# Patient Record
Sex: Female | Born: 1945 | Race: White | Hispanic: No
Health system: Southern US, Community
[De-identification: ages and names within clinical notes are randomized; demographics above are authoritative.]

---

## 1999-01-02 ENCOUNTER — Emergency Department (HOSPITAL_COMMUNITY): Admission: EM | Admit: 1999-01-02 | Discharge: 1999-01-03 | Payer: Self-pay | Admitting: Emergency Medicine

## 1999-02-04 ENCOUNTER — Ambulatory Visit (HOSPITAL_COMMUNITY): Admission: RE | Admit: 1999-02-04 | Discharge: 1999-02-04 | Payer: Self-pay | Admitting: Obstetrics and Gynecology

## 2000-01-28 ENCOUNTER — Other Ambulatory Visit: Admission: RE | Admit: 2000-01-28 | Discharge: 2000-01-28 | Payer: Self-pay | Admitting: *Deleted

## 2000-01-30 ENCOUNTER — Ambulatory Visit (HOSPITAL_COMMUNITY): Admission: RE | Admit: 2000-01-30 | Discharge: 2000-01-30 | Payer: Self-pay | Admitting: *Deleted

## 2000-01-30 ENCOUNTER — Encounter: Payer: Self-pay | Admitting: *Deleted

## 2000-02-05 ENCOUNTER — Encounter: Payer: Self-pay | Admitting: *Deleted

## 2000-02-05 ENCOUNTER — Encounter: Admission: RE | Admit: 2000-02-05 | Discharge: 2000-02-05 | Payer: Self-pay | Admitting: *Deleted

## 2000-06-25 ENCOUNTER — Ambulatory Visit (HOSPITAL_COMMUNITY): Admission: RE | Admit: 2000-06-25 | Discharge: 2000-06-25 | Payer: Self-pay | Admitting: Gastroenterology

## 2001-02-18 ENCOUNTER — Other Ambulatory Visit: Admission: RE | Admit: 2001-02-18 | Discharge: 2001-02-18 | Payer: Self-pay | Admitting: *Deleted

## 2001-03-24 ENCOUNTER — Ambulatory Visit (HOSPITAL_COMMUNITY): Admission: RE | Admit: 2001-03-24 | Discharge: 2001-03-24 | Payer: Self-pay | Admitting: *Deleted

## 2001-03-24 ENCOUNTER — Encounter: Payer: Self-pay | Admitting: *Deleted

## 2001-10-18 ENCOUNTER — Encounter: Payer: Self-pay | Admitting: *Deleted

## 2001-10-18 ENCOUNTER — Encounter: Admission: RE | Admit: 2001-10-18 | Discharge: 2001-10-18 | Payer: Self-pay | Admitting: *Deleted

## 2002-02-28 ENCOUNTER — Other Ambulatory Visit: Admission: RE | Admit: 2002-02-28 | Discharge: 2002-02-28 | Payer: Self-pay | Admitting: *Deleted

## 2002-03-03 ENCOUNTER — Encounter: Admission: RE | Admit: 2002-03-03 | Discharge: 2002-03-03 | Payer: Self-pay | Admitting: *Deleted

## 2002-03-03 ENCOUNTER — Encounter: Payer: Self-pay | Admitting: *Deleted

## 2002-04-29 ENCOUNTER — Encounter: Payer: Self-pay | Admitting: *Deleted

## 2002-04-29 ENCOUNTER — Ambulatory Visit (HOSPITAL_COMMUNITY): Admission: RE | Admit: 2002-04-29 | Discharge: 2002-04-29 | Payer: Self-pay | Admitting: *Deleted

## 2003-06-05 ENCOUNTER — Ambulatory Visit (HOSPITAL_COMMUNITY): Admission: RE | Admit: 2003-06-05 | Discharge: 2003-06-05 | Payer: Self-pay | Admitting: *Deleted

## 2004-06-18 ENCOUNTER — Ambulatory Visit (HOSPITAL_COMMUNITY): Admission: RE | Admit: 2004-06-18 | Discharge: 2004-06-18 | Payer: Self-pay | Admitting: Family Medicine

## 2005-08-13 ENCOUNTER — Ambulatory Visit (HOSPITAL_COMMUNITY): Admission: RE | Admit: 2005-08-13 | Discharge: 2005-08-13 | Payer: Self-pay | Admitting: Internal Medicine

## 2005-10-23 ENCOUNTER — Other Ambulatory Visit: Admission: RE | Admit: 2005-10-23 | Discharge: 2005-10-23 | Payer: Self-pay | Admitting: Internal Medicine

## 2006-01-27 ENCOUNTER — Ambulatory Visit (HOSPITAL_COMMUNITY): Admission: RE | Admit: 2006-01-27 | Discharge: 2006-01-27 | Payer: Self-pay | Admitting: Gastroenterology

## 2006-08-17 ENCOUNTER — Ambulatory Visit (HOSPITAL_COMMUNITY): Admission: RE | Admit: 2006-08-17 | Discharge: 2006-08-17 | Payer: Self-pay | Admitting: Internal Medicine

## 2006-09-06 ENCOUNTER — Emergency Department (HOSPITAL_COMMUNITY): Admission: EM | Admit: 2006-09-06 | Discharge: 2006-09-06 | Payer: Self-pay | Admitting: Emergency Medicine

## 2006-09-18 ENCOUNTER — Encounter: Admission: RE | Admit: 2006-09-18 | Discharge: 2006-09-18 | Payer: Self-pay | Admitting: *Deleted

## 2007-06-30 ENCOUNTER — Emergency Department (HOSPITAL_COMMUNITY): Admission: EM | Admit: 2007-06-30 | Discharge: 2007-06-30 | Payer: Self-pay | Admitting: Family Medicine

## 2007-09-03 IMAGING — US US CAROTID DUPLEX BILAT
1 series · 14 of 24 positions shown · non-contrast
Comparison: none

CLINICAL DATA: Syncope, hypertension.  
 BILATERAL CAROTID DUPLEX ULTRASOUND:
 No previous for comparison.

[Series 1: unknown · 0.06mm/px · 14 of 53 slices shown]
[im 1/53]
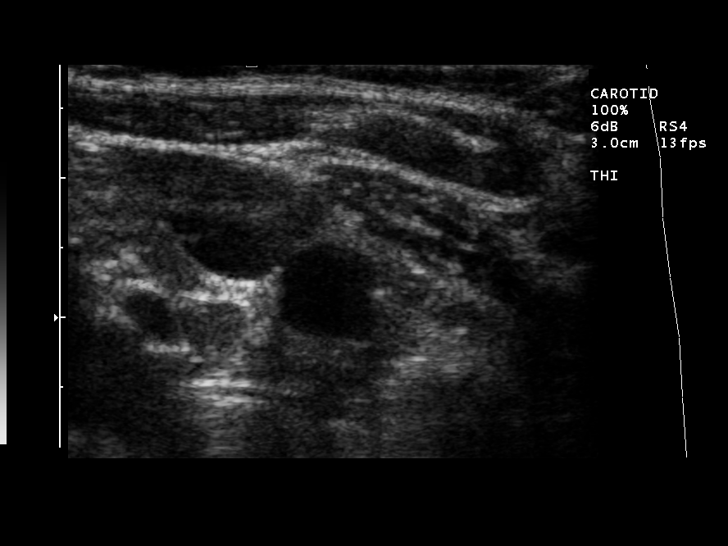
[im 5/53]
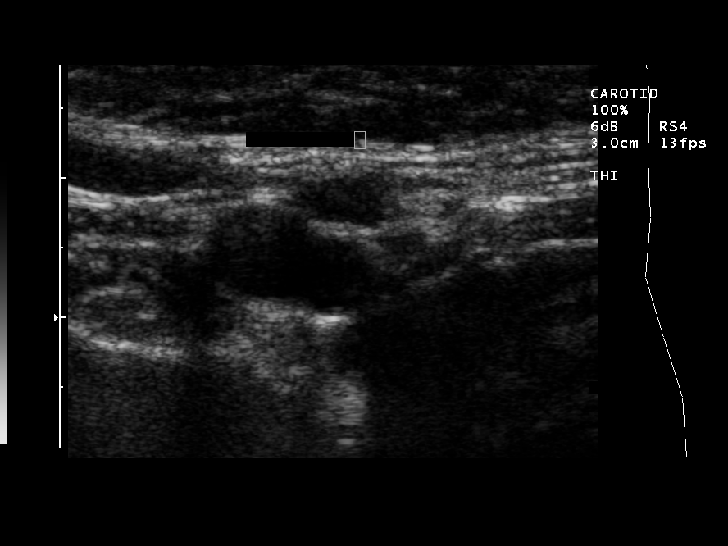
[im 10/53]
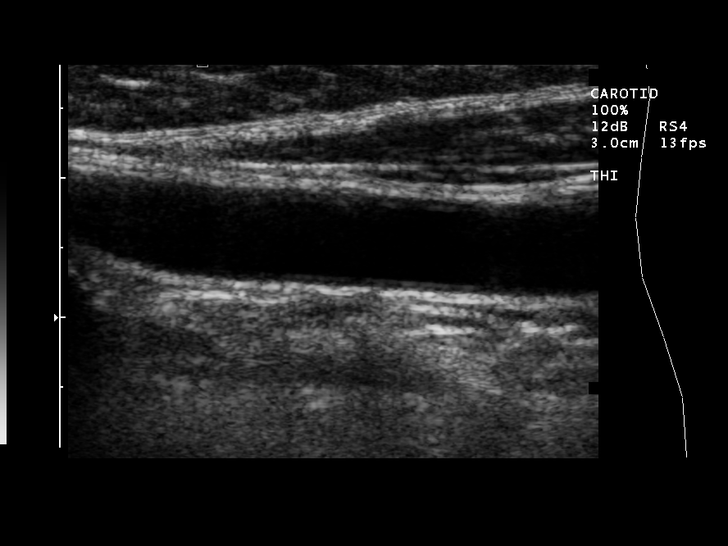
[im 14/53]
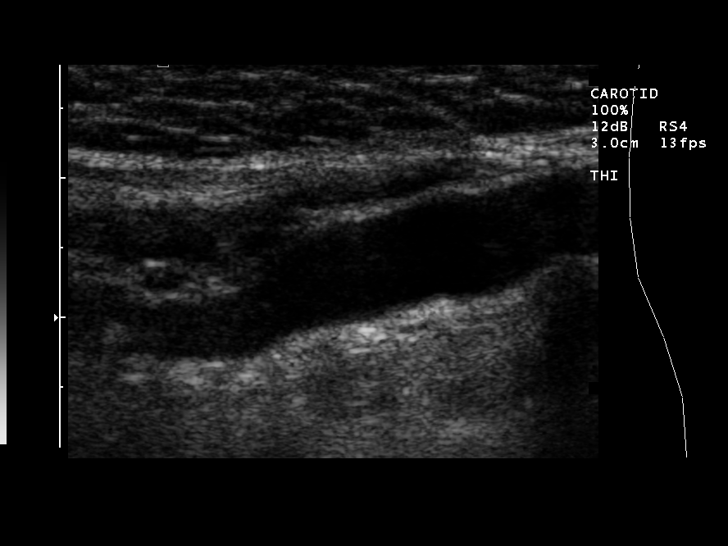
[im 16/53]
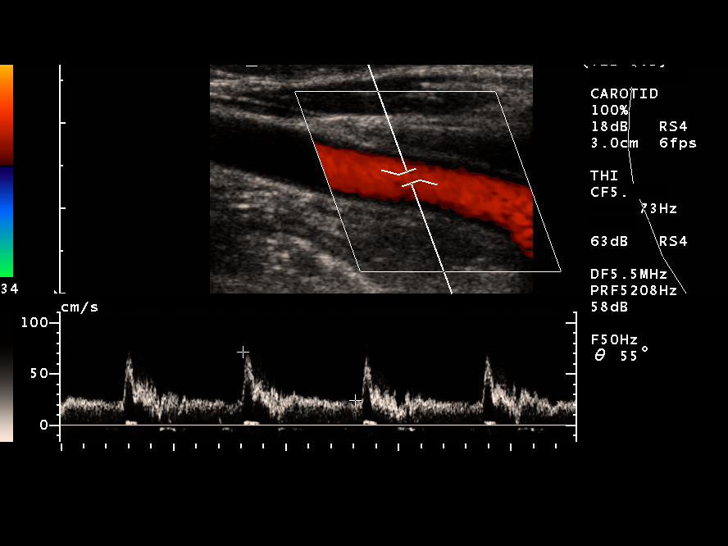
[im 21/53]
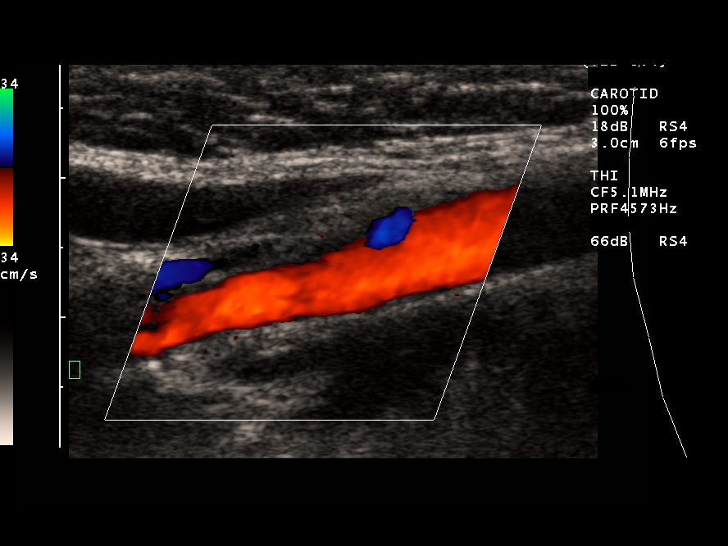
[im 25/53]
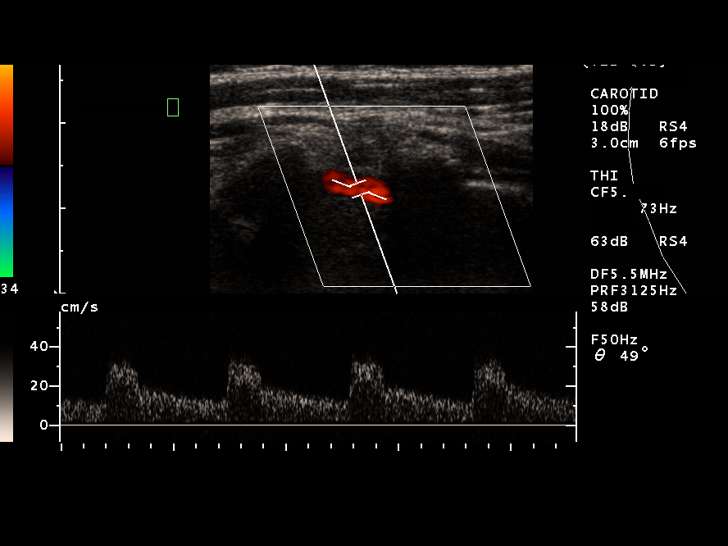
[im 28/53]
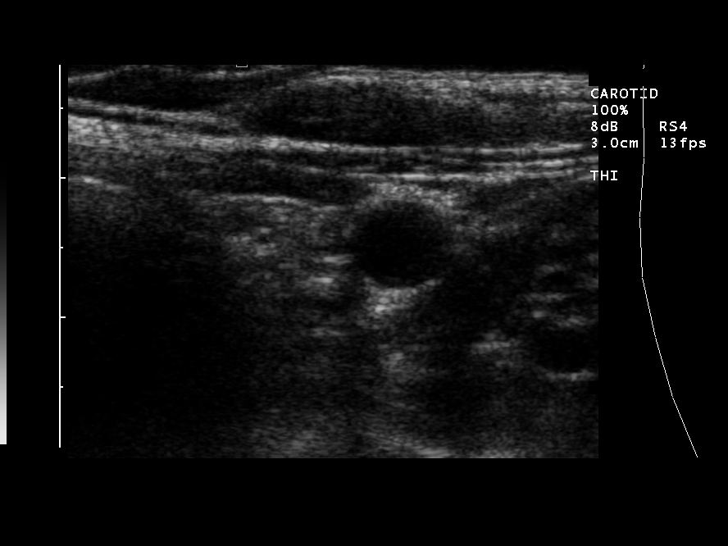
[im 32/53]
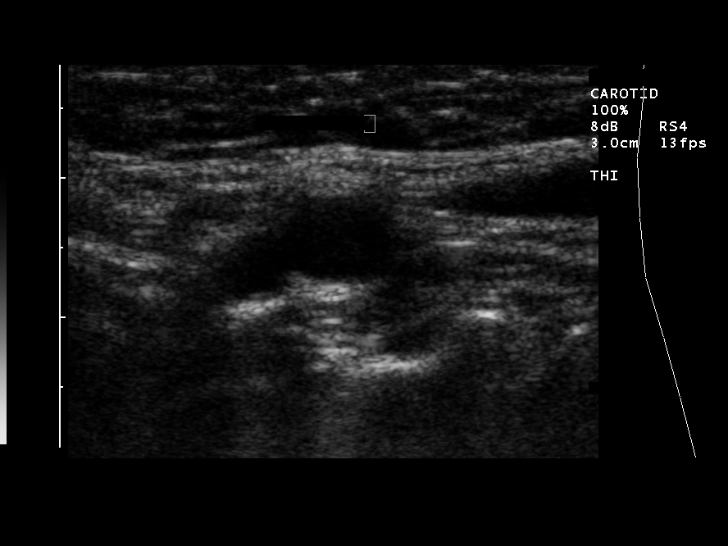
[im 37/53]
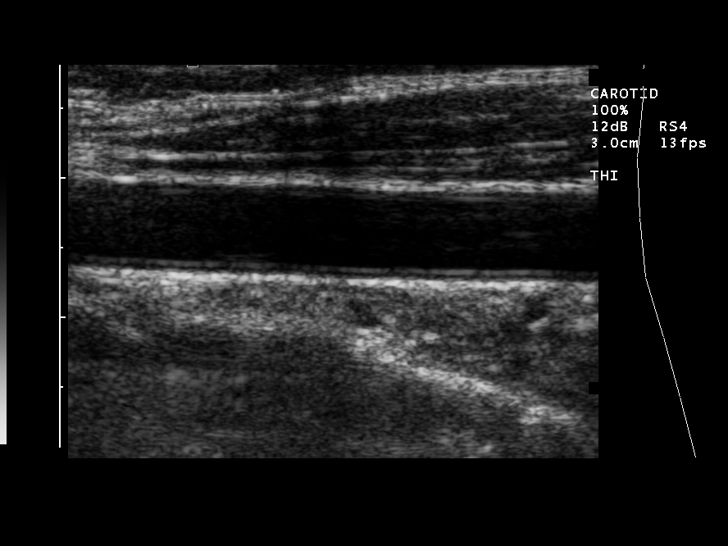
[im 41/53]
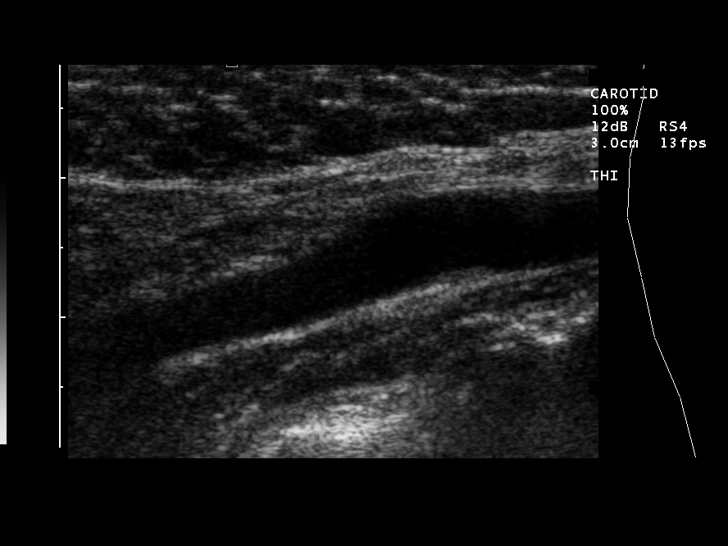
[im 43/53]
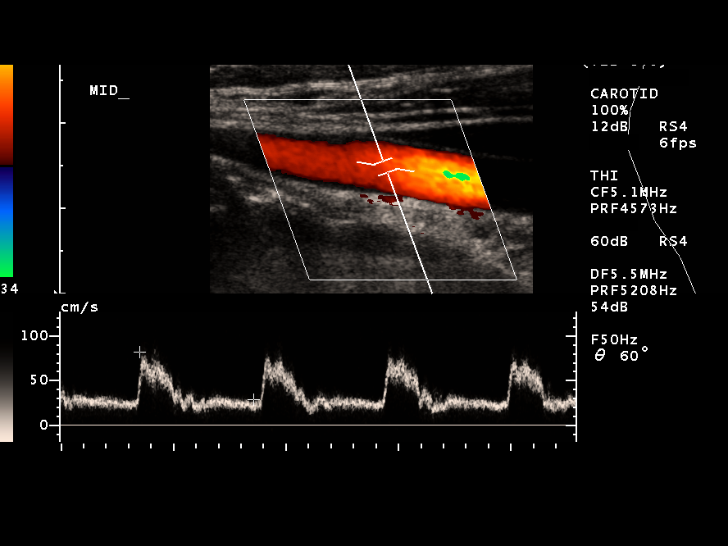
[im 48/53]
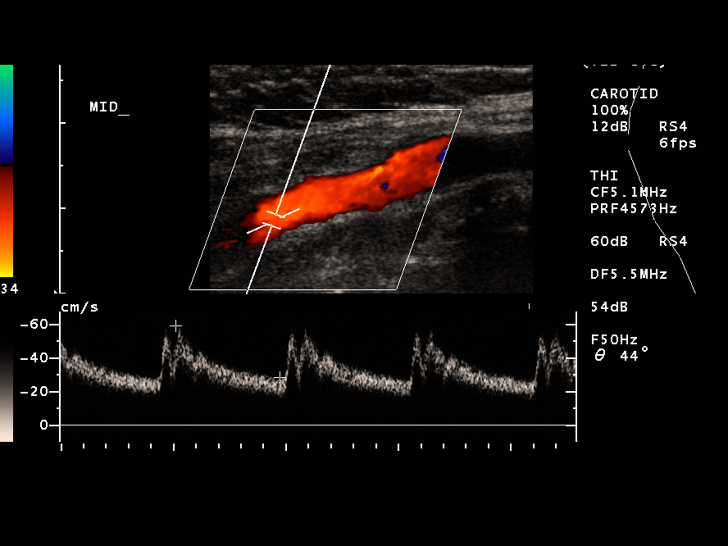
[im 53/53]
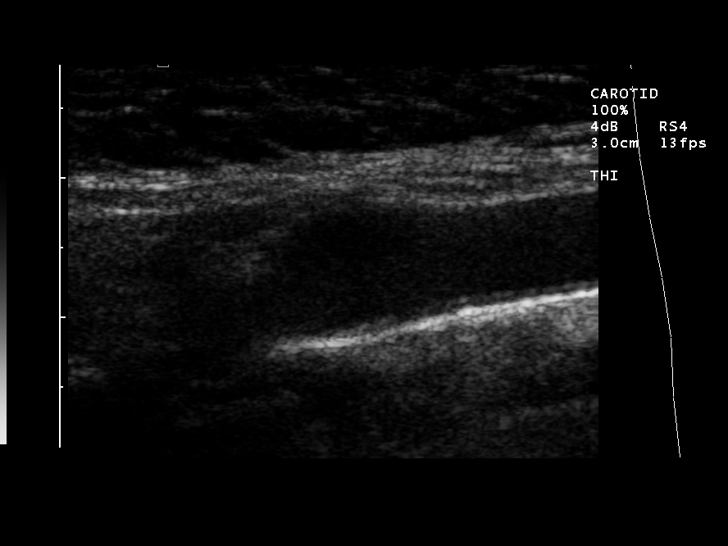

[14 of 24 positions shown; findings below may reference images not displayed]

FINDINGS: There is mild plaque in both carotid bulbs.  Normal waveforms throughout the extracranial carotid circulation.  No focal aliasing on color Doppler interrogation.  Antegrade flow in both vertebral arteries. 
 The following Doppler flow velocity measurements were obtained (in cm/sec):

 SITE:  PEAK SYSTOLIC  END DIASTOLIC

 RIGHT  ICA:    77        23
 RIGHT CCA:  79  25
 RIGHT ICA/CCA RATIO:  .97  .89
 RIGHT ECA:    89
 LEFT ICA:  72  36 
 LEFT CCA:  89  29
 LEFT ICA/CCA RATIO:  .81
 LEFT ECA:  102
 Criteria:  Quantification of carotid stenosis is based on velocity parameters that correlate the residual internal carotid diameter with NASCET-based stenosis levels.
IMPRESSION: Minimal bilateral carotid bifurcation plaque without hemodynamically significant stenosis.  Continued surveillance recommended.

## 2007-10-05 ENCOUNTER — Ambulatory Visit (HOSPITAL_COMMUNITY): Admission: RE | Admit: 2007-10-05 | Discharge: 2007-10-05 | Payer: Self-pay | Admitting: Family Medicine

## 2007-11-04 ENCOUNTER — Emergency Department (HOSPITAL_COMMUNITY): Admission: EM | Admit: 2007-11-04 | Discharge: 2007-11-04 | Payer: Self-pay | Admitting: Emergency Medicine

## 2008-05-26 ENCOUNTER — Ambulatory Visit (HOSPITAL_BASED_OUTPATIENT_CLINIC_OR_DEPARTMENT_OTHER): Admission: RE | Admit: 2008-05-26 | Discharge: 2008-05-26 | Payer: Self-pay | Admitting: Family Medicine

## 2008-06-03 ENCOUNTER — Ambulatory Visit: Payer: Self-pay | Admitting: Internal Medicine

## 2008-08-19 ENCOUNTER — Emergency Department (HOSPITAL_COMMUNITY): Admission: EM | Admit: 2008-08-19 | Discharge: 2008-08-19 | Payer: Self-pay | Admitting: Family Medicine

## 2008-09-05 ENCOUNTER — Ambulatory Visit (HOSPITAL_BASED_OUTPATIENT_CLINIC_OR_DEPARTMENT_OTHER): Admission: RE | Admit: 2008-09-05 | Discharge: 2008-09-05 | Payer: Self-pay | Admitting: Family Medicine

## 2008-09-05 ENCOUNTER — Encounter: Payer: Self-pay | Admitting: Internal Medicine

## 2008-10-05 ENCOUNTER — Ambulatory Visit (HOSPITAL_COMMUNITY): Admission: RE | Admit: 2008-10-05 | Discharge: 2008-10-05 | Payer: Self-pay | Admitting: Family Medicine

## 2008-11-14 ENCOUNTER — Ambulatory Visit: Payer: Self-pay | Admitting: Internal Medicine

## 2008-11-14 DIAGNOSIS — G4700 Insomnia, unspecified: Secondary | ICD-10-CM | POA: Insufficient documentation

## 2008-11-14 DIAGNOSIS — G4733 Obstructive sleep apnea (adult) (pediatric): Secondary | ICD-10-CM | POA: Insufficient documentation

## 2008-12-02 DIAGNOSIS — G4730 Sleep apnea, unspecified: Secondary | ICD-10-CM | POA: Insufficient documentation

## 2009-01-10 ENCOUNTER — Telehealth (INDEPENDENT_AMBULATORY_CARE_PROVIDER_SITE_OTHER): Payer: Self-pay | Admitting: *Deleted

## 2009-03-14 ENCOUNTER — Emergency Department (HOSPITAL_COMMUNITY): Admission: EM | Admit: 2009-03-14 | Discharge: 2009-03-14 | Payer: Self-pay | Admitting: Emergency Medicine

## 2009-08-13 ENCOUNTER — Other Ambulatory Visit: Admission: RE | Admit: 2009-08-13 | Discharge: 2009-08-13 | Payer: Self-pay | Admitting: Family Medicine

## 2009-09-12 ENCOUNTER — Ambulatory Visit (HOSPITAL_COMMUNITY): Admission: RE | Admit: 2009-09-12 | Discharge: 2009-09-12 | Payer: Self-pay | Admitting: Family Medicine

## 2009-10-01 ENCOUNTER — Encounter: Admission: RE | Admit: 2009-10-01 | Discharge: 2009-10-01 | Payer: Self-pay | Admitting: Internal Medicine

## 2009-10-12 ENCOUNTER — Ambulatory Visit (HOSPITAL_COMMUNITY): Admission: RE | Admit: 2009-10-12 | Discharge: 2009-10-12 | Payer: Self-pay | Admitting: Family Medicine

## 2009-10-15 ENCOUNTER — Encounter: Payer: Self-pay | Admitting: Family Medicine

## 2009-10-25 ENCOUNTER — Ambulatory Visit: Payer: Self-pay | Admitting: Sports Medicine

## 2009-10-25 ENCOUNTER — Encounter: Payer: Self-pay | Admitting: Family Medicine

## 2009-10-25 DIAGNOSIS — S93609A Unspecified sprain of unspecified foot, initial encounter: Secondary | ICD-10-CM | POA: Insufficient documentation

## 2009-10-25 DIAGNOSIS — M79609 Pain in unspecified limb: Secondary | ICD-10-CM

## 2009-10-25 DIAGNOSIS — M7750 Other enthesopathy of unspecified foot: Secondary | ICD-10-CM | POA: Insufficient documentation

## 2010-09-16 IMAGING — CR DG FOOT COMPLETE 3+V*R*
3 series · 3 of 3 positions shown · non-contrast
Comparison: None

CLINICAL DATA: Patient fell with pain laterally

RIGHT FOOT COMPLETE - 3+ VIEW

[view not recorded (1 of 3)]
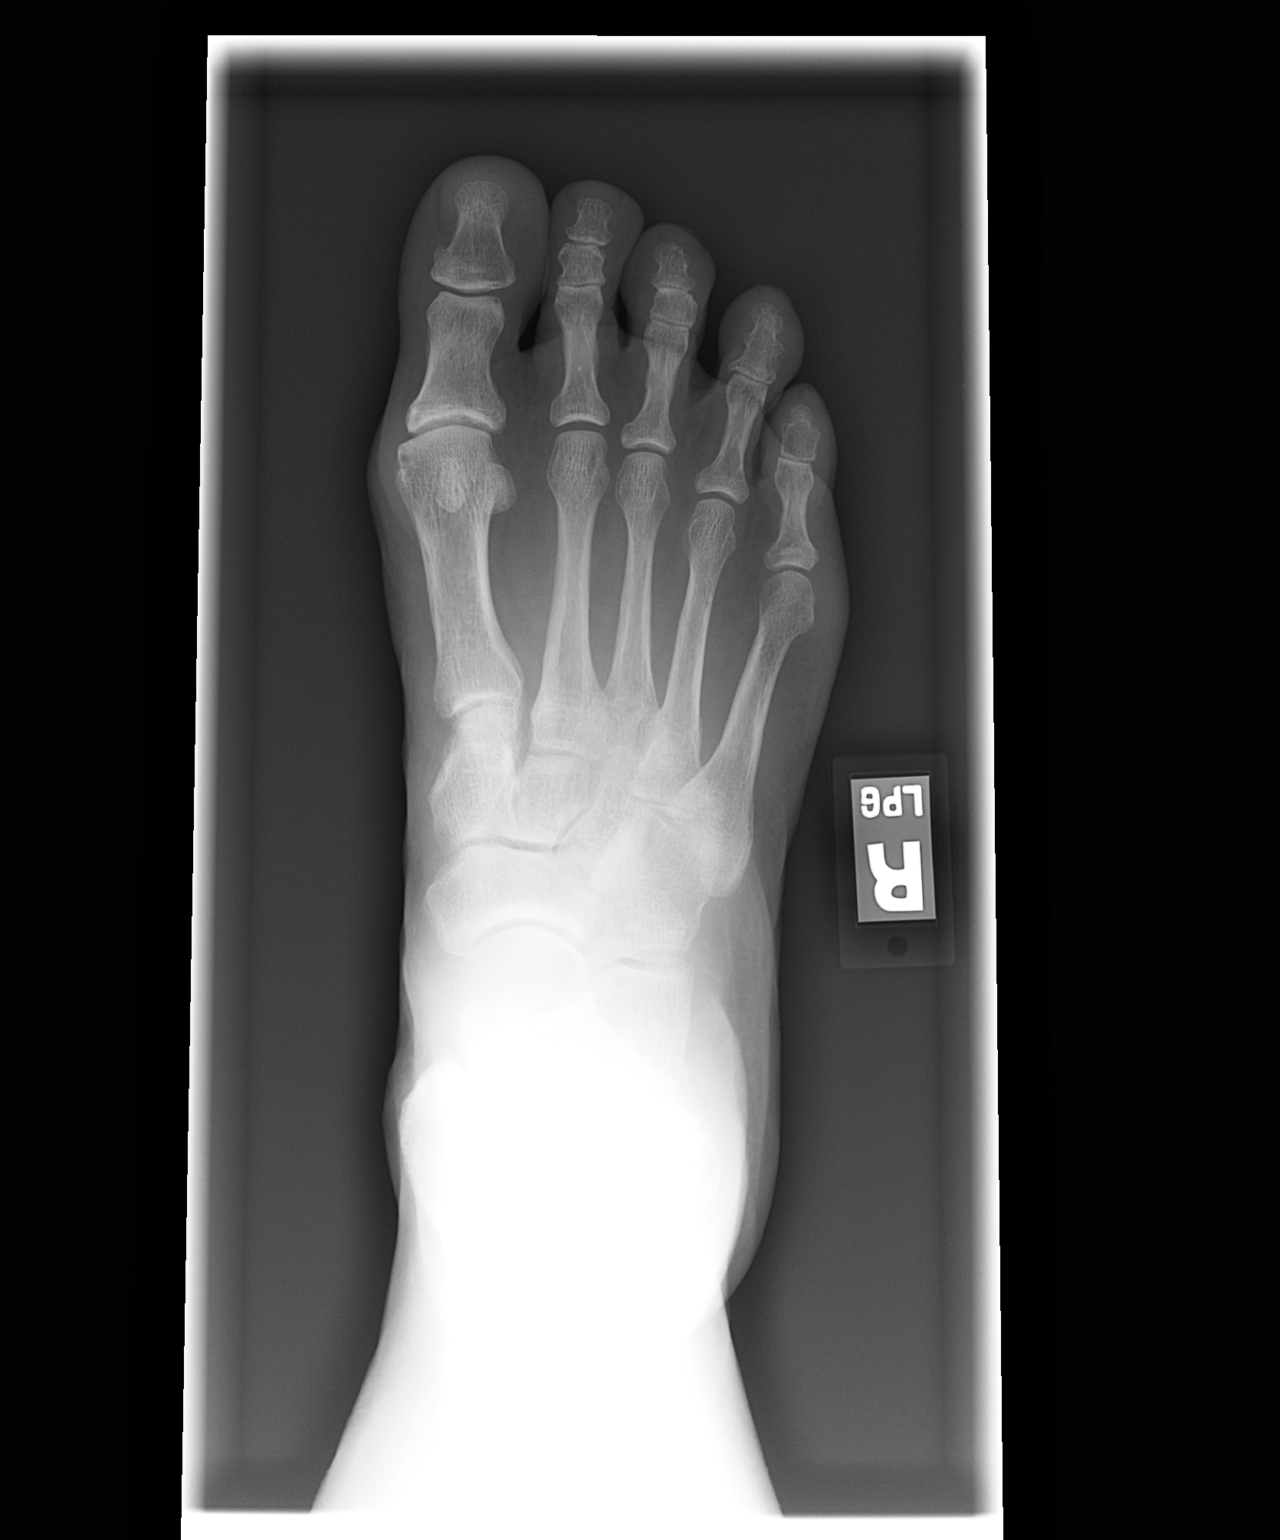

[view not recorded (2 of 3)]
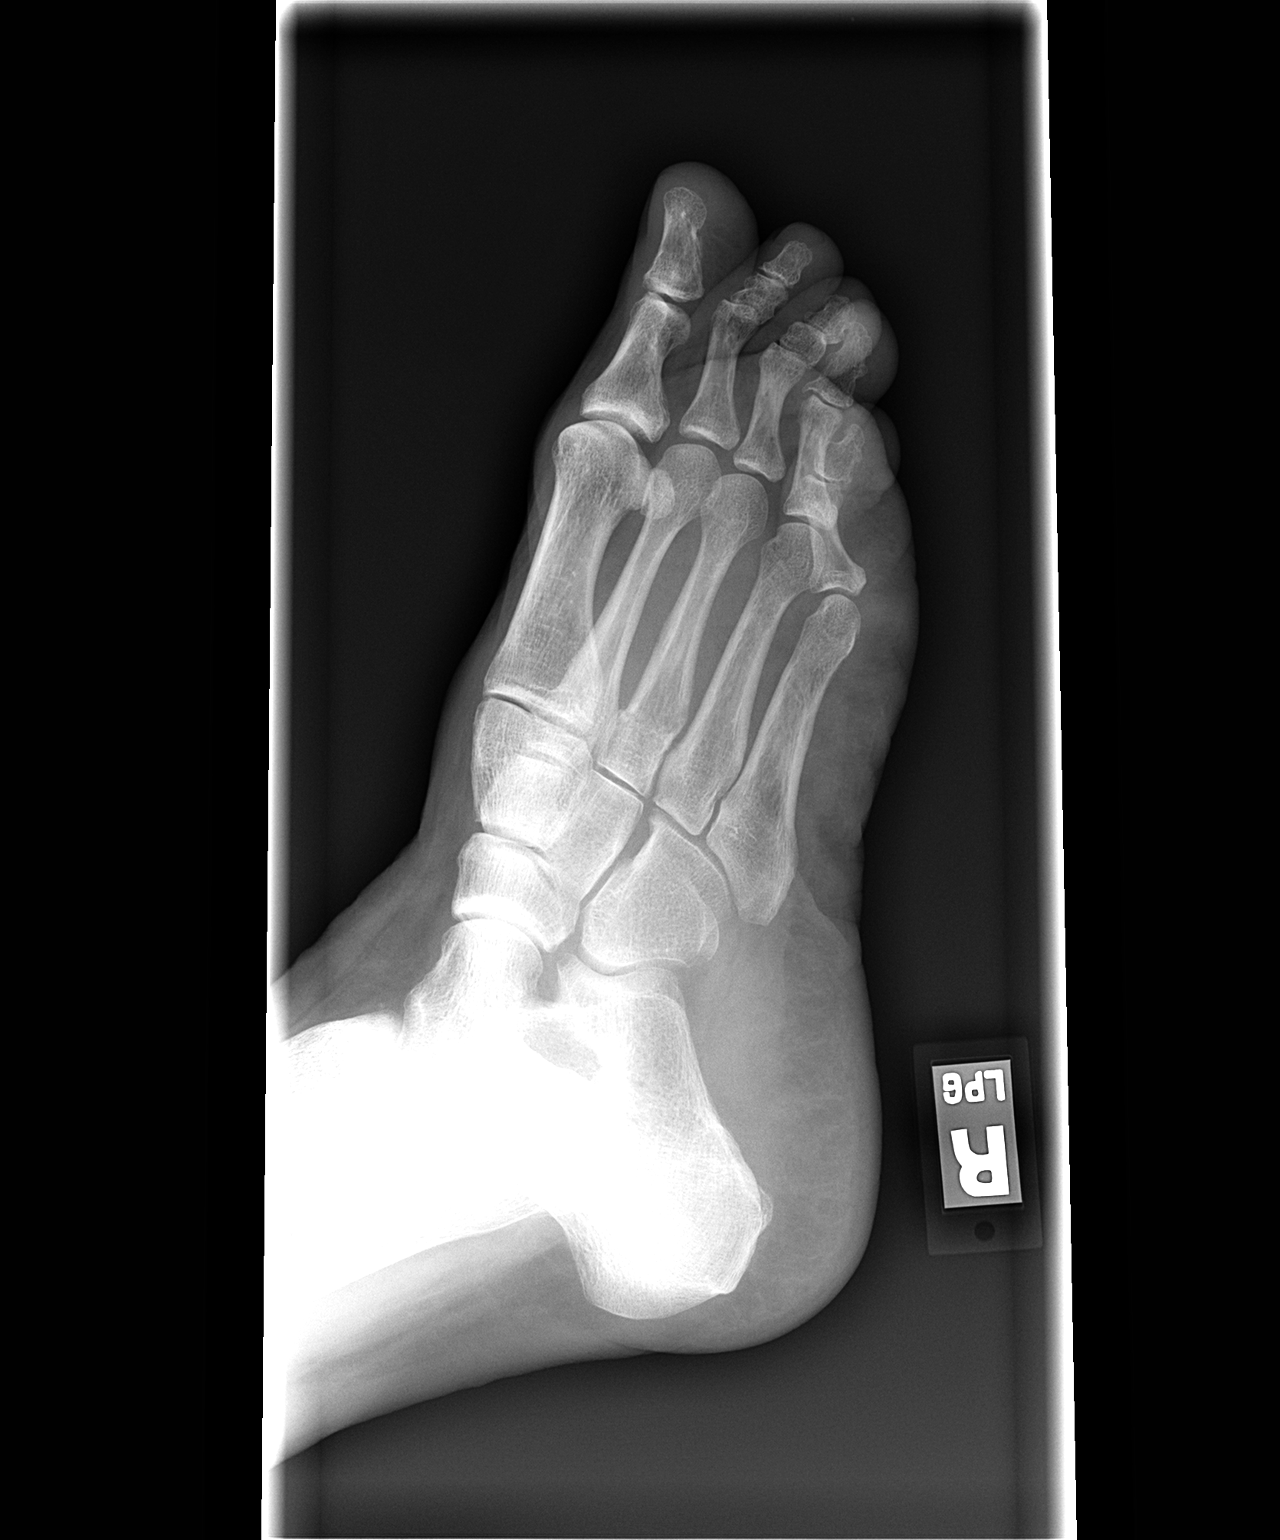

[view not recorded (3 of 3)]
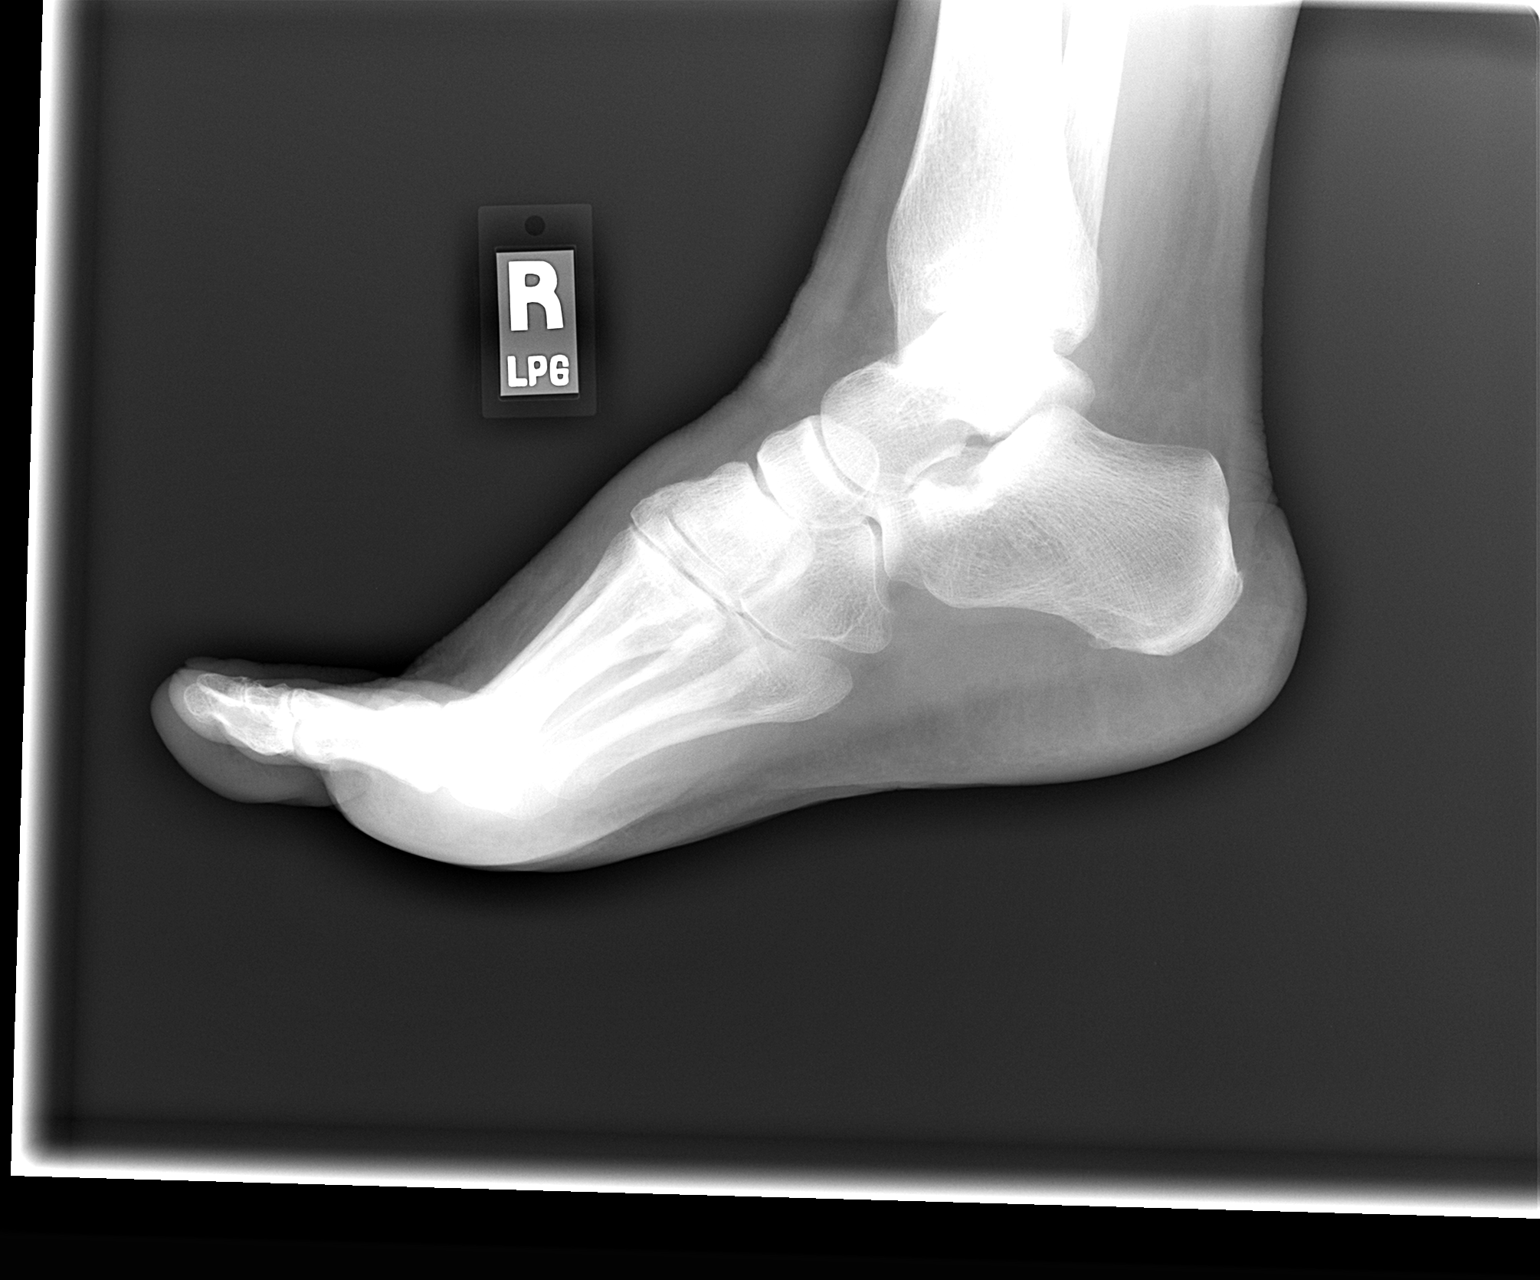

[3 of 3 positions shown; findings below may reference images not displayed]

FINDINGS: The tarsal - metatarsal alignment is normal.  There is
mild degenerative change at the right first MTP joint no acute
fracture is seen.
IMPRESSION: Negative

## 2010-10-15 NOTE — Consult Note (Signed)
Summary: [facility]  [facility]   Imported By: Marily Memos 10/25/2009 14:50:39  _____________________________________________________________________  External Attachment:    Type:   Image     Comment:   External Document

## 2010-10-15 NOTE — Letter (Signed)
Summary: *Consult Note  [facility]  [address]   [place], [place] [address]   Phone: [phone]  Fax: [phone]    Re:    Joann Fisher DOB:    June 29, 1946   Dear Dr. Marny Lowenstein,   Thank you for requesting that we see the above patient for consultation.  A copy of the detailed office note will be sent under separate cover, for your review.  Evaluation today is consistent with: right midfoot sprain and second toe metatarsalgia   Our recommendation is for: 1) Wear the arch binder at all times 2) Wear a lace up shoe until better 3) Given new sports insoles with a metatarsal pad on the right insert 4) Return in 3-4 weeks for follow-up.   New Orders include: See above   New Medications started today include: None   After today's visit, the patients current medications include: 1)  LISINOPRIL 20 MG TABS (LISINOPRIL) 1 once daily 2)  SIMVASTATIN 40 MG TABS (SIMVASTATIN) 1 once daily 3)  ALPRAZOLAM 1 MG TABS (ALPRAZOLAM) 1 1/2 tablet at bedtime 4)  CLONAZEPAM 0.5 MG TABS (CLONAZEPAM) Take 1 tab by mouth at bedtime   Thank you for this consultation.  If you have any further questions regarding the care of this patient, please do not hesitate to contact me @   Thank you for this opportunity to look after your patient.  Sincerely,   Jannifer Rodney MD

## 2010-10-15 NOTE — Assessment & Plan Note (Signed)
Summary: WC,RT FOOT INJURY,MC   Vital Signs:  Patient profile:   65 year old female Height:      61 inches Weight:      146 pounds BMI:     27.69 BP sitting:   117 / 82  Vitals Entered By: Lillia Pauls CMA (October 25, 2009 2:53 PM)  Referring Provider:  Dr Merri Brunette Primary Provider:  Merri Brunette   History of Present Illness: Pt presents as a consultation from Dr. Marny Lowenstein of occupational health for right foot pain. The patient was leaning over at [facility] to give an infant a pacifier on 09/13/2009 when she got her right foot caught in a bouncy seat and fell. She had right midfoot pain, bruising and swelling afterwards. She was seen at occupational medicine and was placed in a post-op shoe for a while which was helpful. She also had a negative x-ray of her right foot per the occupational medicine notes. She has noticed a significant improvement in her pain over the last two weeks. She is no longer in the post-op shoe and has been able to return to normal tennis shoes in the past 24 hours without pain. She still has minimal edema at the end of the day as well as some aching at times. She has been able to do her full duties at work without any difficulty.    Her only pain that she has at this time is a sensation of "walking on a marble" with pain under her right second toe that she did not notice until she had her injury. No prior foot or ankle injuries. She has not been taking any medications recently for her right foot pain.   Allergies: 1)  ! Sulfa 2)  ! Macrobid (Nitrofurantoin Monohyd Macro)  Physical Exam  General:  alert and well-developed.   Head:  normocephalic and atraumatic.   Neck:  supple.   Lungs:  normal respiratory effort.   Msk:  Right Foot: Bunion developing with mild bunionette development Transverse arch collapse with 2nd and 4th MT heads on the ground Mild hindfoot valgus Mild splaying between 3rd adn 4th toes No swelling, bruising or bony abnormalities  otherwise Full ROM of her ankle No TTP throughout the ankle. Mild TTP along the plantar surface under her second MT Callus under 2nd MT and 4th MT Mild pain with resisted dorsiflexion and inversion No pain with resisted plantarflexion and eversion 5/5 strength  Left Foot: No bony abnormalities, edema or bruising Bunion forming  No splaying between the toes Transverse arch breakdown Mild bunnionette formation No TTP throughout Full ROM 5/5 strength with resisted ROM exercises Able to walk without a limp   Impression & Recommendations:  Problem # 1:  FOOT SPRAIN (ICD-845.10) Assessment New  DOI 09/13/2009 - Improving 1. Can continue normal duties at this time 2. Given an arch binder to wear at all times when working so that she does not develop edema at the end of the day 3. Continue to wear lace-up shoes with her new sports insoles for better cushioning and arch control 4. Ice for 20 minutes at the end of the day if there is swelling 5. Ibuprofen as needed for pain 6. Return for follow-up in 3-4 weeks  Orders: Garment,belt,sleeve or other covering ,elastic or similar stretch (U1324) Sports Insoles (M0102)  Problem # 2:  METATARSALGIA (ICD-726.70) Assessment: New  Likley the cause of the pain under her 2nd metatarsal of the right foot 1. Given sports insoles for extra cushion  with a metatarsal pad (small) for her right foot 2. Patient tolerated the MT pad and was told that it should become more comfortable the more she walks on the new insoles  Orders: Sports Insoles (G2952)  Problem # 3:  FOOT PAIN (ICD-729.5) Assessment: New  See above for treatment plan  Orders: Sports Insoles (W4132)  Complete Medication List: 1)  Lisinopril 20 Mg Tabs (Lisinopril) .Marland Kitchen.. 1 once daily 2)  Simvastatin 40 Mg Tabs (Simvastatin) .Marland Kitchen.. 1 once daily 3)  Alprazolam 1 Mg Tabs (Alprazolam) .Marland Kitchen.. 1 1/2 tablet at bedtime 4)  Clonazepam 0.5 Mg Tabs (Clonazepam) .... Take 1 tab by mouth at  bedtime

## 2010-10-25 ENCOUNTER — Other Ambulatory Visit (HOSPITAL_COMMUNITY): Payer: Self-pay | Admitting: Family Medicine

## 2010-10-25 DIAGNOSIS — Z1231 Encounter for screening mammogram for malignant neoplasm of breast: Secondary | ICD-10-CM

## 2010-11-01 ENCOUNTER — Ambulatory Visit (HOSPITAL_COMMUNITY): Payer: 59 | Attending: Family Medicine

## 2010-11-12 ENCOUNTER — Encounter (HOSPITAL_COMMUNITY): Payer: Self-pay

## 2010-11-12 ENCOUNTER — Ambulatory Visit (HOSPITAL_COMMUNITY)
Admission: RE | Admit: 2010-11-12 | Discharge: 2010-11-12 | Disposition: A | Discharge status: Home / Self Care | Payer: 59 | Source: Ambulatory Visit | Attending: Family Medicine | Admitting: Family Medicine

## 2010-11-12 DIAGNOSIS — Z1231 Encounter for screening mammogram for malignant neoplasm of breast: Secondary | ICD-10-CM

## 2010-12-23 LAB — POCT URINALYSIS DIP (DEVICE)
Bilirubin Urine: NEGATIVE
Glucose, UA: NEGATIVE mg/dL
Hgb urine dipstick: NEGATIVE
Nitrite: NEGATIVE
Specific Gravity, Urine: <=1.005 (ref 1.005–1.030)
Urobilinogen, UA: 0.2 mg/dL (ref 0.0–1.0)
pH: 5.5 (ref 5.0–8.0)

## 2011-01-28 NOTE — Procedures (Signed)
NAME:  Joann Fisher, Joann Fisher              ACCOUNT NO.:  [id number]   MEDICAL RECORD NO.:  [id number]          PATIENT TYPE:  OUT   LOCATION:  SLEEP CENTER                 FACILITY:  [facility]   PHYSICIAN:  Clinton D. Maple Hudson, MD, FCCP, FACPDATE OF BIRTH:  Jul 31, 1946   DATE OF STUDY:  05/26/2008                            NOCTURNAL POLYSOMNOGRAM   REFERRING PHYSICIAN:   REFERRING PHYSICIAN:  Dario Guardian, M.D.   INDICATION FOR STUDY:  1. Insomnia with sleep apnea.  2. Anxiety in anticipation of bedtime.   EPWORTH SLEEPINESS SCORE:  2/24.   BMI:  28.3.   WEIGHT:  150 pounds.   HEIGHT:  61 inches.   NECK:  14.5 inches.   HOME MEDICATIONS:  Charted and reviewed.   SLEEP ARCHITECTURE:  Total sleep time 254 minutes with sleep efficiency  63.8%.  Stage I was 3%.  Stage II 97%.  Stages III and REM were absent.  Sleep latency 71 minutes.  REM latency N/A.  Awake after sleep onset  70.5 minutes.  Arousal index 1.9 per hour.  At bedtime, she took 1.5 mg  of Xanax.   RESPIRATORY DATA:  Apnea-hypopnea index (AHI) 13.9 per hour.  Events  were recorded as supine.  There were insufficient events in the first  part of the night to permit CPAP titration by split protocol on this  study night.  This partly reflected delayed sleep onset.   OXYGEN DATA:  Moderately loud snoring with oxygen desaturation to a  nadir of 75%.  Mean oxygen saturation through the study was 94% on room  air.   CARDIAC DATA:  Sinus rhythm with occasional PVC.   MOVEMENT-PARASOMNIA:  No significant limb movement disturbance.  Bathroom x1.   IMPRESSIONS-RECOMMENDATIONS:  1. Sleep architecture significant for use of Xanax 1.5 mg taken at      9:00 p.m.  Sustained sleep onset was not recorded until about 12:20      a.m., after which sleep pattern was almost exclusively stage II      sleep, suggestive of medication effect.  2. Mild obstructive sleep apnea/hypopnea syndrome, apnea-hypopnea      index 13.9 per hour.   Moderately loud snoring with oxygen      desaturation to a nadir of 85%.  3. Recorded events were all while sleeping supine, suggesting the      conservative option of encouraging sleep off flat of back.  Scores      in this range would qualify for a trial of CPAP therapy.  Consider      return for CPAP titration or evaluate for alternative management as      appropriate.  4. The technician indicated that the patient is deaf and removed her      hearing aids after lights out.      Clinton D. Maple Hudson, MD, [facility], FACP  Diplomate, Biomedical engineer of Sleep Medicine  Electronically Signed     CDY/MEDQ  D:  06/03/2008 08:52:36  T:  06/03/2008 13:27:08  Job:  [id number]

## 2011-01-31 NOTE — Procedures (Signed)
[place]. [facility]  Patient:    Joann Fisher, Joann Fisher                       MRN: [id number] Proc. Date: 06/25/00 Adm. Date:  [id number] Attending:  Charna Elizabeth CC:         Heather Roberts, M.D.   Procedure Report  PROCEDURE:  Colonoscopy.  DATE OF BIRTH:  12/10/1945  ENDOSCOPIST:  Anselmo Rod, M.D.  INSTRUMENT USED:  Olympus video colonoscope.  INDICATIONS FOR PROCEDURE:  Rectal bleeding in a 65 year old white female who had colonic polyps, hemorrhoids, etc.  PREPROCEDURE PREPARATION:  Informed consent was obtained from the patient, the patient fasted for 8 hours prior to the procedure and was prepped with a bottle of magnesium citrate and a gallon of NuLytely the night prior to the procedure.  PHYSICAL EXAMINATION:  VITAL SIGNS:  Stable.  NECK:  Supple.  CHEST:  Clear to auscultation and percussion.  ABDOMEN:  Soft with normal abdominal bowel sounds.  DESCRIPTION OF PROCEDURE:  The patient was placed in the left lateral decubitus position and sedated with 75 mg of Demerol and 7 mg of Versed intravenously. Once the patient was adequately sedated and maintained on low flow oxygen and continuous cardiac monitoring, the Olympus video colonoscope was advanced from the rectum to the cecum with extreme difficulty. There was a large amount of rectal stool in the colon and the patient had a very poor prep. The patient had some left sided diverticulosis and small nonbleeding internal hemorrhoids seen on retroflexion. No large masses or polyps were seen but very small lesions may have been missed secondary to poor prep. The procedure was complete up to the cecum.  IMPRESSION: 1. Very poor prep, large amount of stool in the colon. 2. Left sided diverticulosis. 3. Small internal hemorrhoids.  RECOMMENDATIONS: 1. The patient has been advised to increase the fruit and fiber in her diet. 2. Outpatient follow-up on a p.r.n. basis. 3. Repeat  colorectal cancer screening is recommended in the next 5-10    years or earlier if she were to develop any abdominal symptoms in the    interim. DD:  06/25/00 TD:  06/26/00 Job: [id number]

## 2011-01-31 NOTE — Procedures (Signed)
NAME:  Joann Fisher, Joann Fisher              ACCOUNT NO.:  [id number]   MEDICAL RECORD NO.:  [id number]          PATIENT TYPE:  OUT   LOCATION:  SLEEP CENTER                 FACILITY:  [facility]   PHYSICIAN:  Clinton D. Maple Hudson, MD, FCCP, FACPDATE OF BIRTH:  06-30-1946   DATE OF STUDY:                            NOCTURNAL POLYSOMNOGRAM   REFERRING PHYSICIAN:   DATE OF STUDY:  September 05, 2008.   INDICATION FOR STUDY:  Hypersomnia with sleep apnea.   EPWORTH SLEEPINESS SCORE:  Epworth sleepiness score 1/24.  BMI 28.3.  Weight 150 pounds.  Height 61 inches.  Neck 14.5 inches.   MEDICATIONS:  Home medications are charted and reviewed.  A baseline  diagnostic study on May 26, 2008, recorded an AHI of 13.9 per  hour.  CPAP titration is requested.   SLEEP ARCHITECTURE:  Total sleep time 336 minutes with sleep efficiency  83%.  Stage I was 4.2%.  Stage II 87.7%.  Stage III absent.  REM 8.2% of  total sleep time.  Sleep latency 33 minutes.  REM latency 331 minutes.  Awake after sleep onset 36 minutes.  Arousal index 33.5.  Simvastatin  and melatonin were taken at bedtime.   RESPIRATORY DATA:  CPAP titration protocol.  CPAP was titrated to 8 CWP,  AHI zero per hour.  She wore a small ResMed mirage Quattro mask with  heated humidifier.   OXYGEN DATA:  Snoring was prevented by CPAP and mean oxygen saturation  held at 97.1% on room air.   CARDIAC DATA:  Normal sinus rhythm.   MOVEMENT-PARASOMNIA:  No significant movement disturbance.  Bathroom x1.   IMPRESSIONS-RECOMMENDATIONS:  1. Successful continuous positive airway pressure titration to 8      centimeters of water pressure, apnea-hypopnea index zero per hour.      She chose a small ResMed mirage Quattro mask with heated      humidifier.  2. Baseline diagnostic nocturnal polysomnography on May 26, 2008, had recorded an apnea-hypopnea index of 13.9 per hour.      Clinton D. Maple Hudson, MD, [facility], FACP  Diplomate, Biomedical engineer  of Sleep Medicine  Electronically Signed     CDY/MEDQ  D:  09/10/2008 10:26:53  T:  09/11/2008 00:10:32  Job:  [id number]

## 2011-06-06 LAB — POCT URINALYSIS DIP (DEVICE)
Bilirubin Urine: NEGATIVE
Protein, ur: NEGATIVE
Urobilinogen, UA: 0.2
pH: 6.5

## 2011-06-25 LAB — POCT URINALYSIS DIP (DEVICE)
Bilirubin Urine: NEGATIVE
Ketones, ur: NEGATIVE
Specific Gravity, Urine: 1.02
Urobilinogen, UA: 0.2

## 2011-10-29 ENCOUNTER — Other Ambulatory Visit (HOSPITAL_COMMUNITY): Payer: Self-pay | Admitting: Family Medicine

## 2011-10-29 DIAGNOSIS — Z1231 Encounter for screening mammogram for malignant neoplasm of breast: Secondary | ICD-10-CM

## 2011-12-05 ENCOUNTER — Emergency Department (HOSPITAL_COMMUNITY)
Admission: EM | Admit: 2011-12-05 | Discharge: 2011-12-05 | Disposition: A | Discharge status: Home / Self Care | Payer: 59 | Source: Home / Self Care | Attending: Emergency Medicine | Admitting: Emergency Medicine

## 2011-12-05 ENCOUNTER — Encounter (HOSPITAL_COMMUNITY): Payer: Self-pay | Admitting: *Deleted

## 2011-12-05 DIAGNOSIS — S1093XA Contusion of unspecified part of neck, initial encounter: Secondary | ICD-10-CM

## 2011-12-05 DIAGNOSIS — S161XXA Strain of muscle, fascia and tendon at neck level, initial encounter: Secondary | ICD-10-CM

## 2011-12-05 DIAGNOSIS — S0093XA Contusion of unspecified part of head, initial encounter: Secondary | ICD-10-CM

## 2011-12-05 DIAGNOSIS — S139XXA Sprain of joints and ligaments of unspecified parts of neck, initial encounter: Secondary | ICD-10-CM

## 2011-12-05 NOTE — Discharge Instructions (Signed)
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
## 2011-12-10 ENCOUNTER — Ambulatory Visit (HOSPITAL_COMMUNITY)
Admission: RE | Admit: 2011-12-10 | Discharge: 2011-12-10 | Disposition: A | Discharge status: Home / Self Care | Payer: 59 | Source: Ambulatory Visit | Attending: Family Medicine | Admitting: Family Medicine

## 2011-12-10 DIAGNOSIS — Z1231 Encounter for screening mammogram for malignant neoplasm of breast: Secondary | ICD-10-CM | POA: Insufficient documentation

## 2012-12-14 ENCOUNTER — Other Ambulatory Visit (HOSPITAL_COMMUNITY): Payer: Self-pay | Admitting: Family Medicine

## 2012-12-14 DIAGNOSIS — Z1231 Encounter for screening mammogram for malignant neoplasm of breast: Secondary | ICD-10-CM

## 2013-01-13 ENCOUNTER — Ambulatory Visit (HOSPITAL_COMMUNITY)
Admission: RE | Admit: 2013-01-13 | Discharge: 2013-01-13 | Disposition: A | Discharge status: Home / Self Care | Payer: Medicare Other | Source: Ambulatory Visit | Attending: Family Medicine | Admitting: Family Medicine

## 2013-01-13 DIAGNOSIS — Z1231 Encounter for screening mammogram for malignant neoplasm of breast: Secondary | ICD-10-CM | POA: Insufficient documentation

## 2013-03-24 ENCOUNTER — Other Ambulatory Visit: Payer: Self-pay | Admitting: Family Medicine

## 2013-03-24 ENCOUNTER — Other Ambulatory Visit (HOSPITAL_COMMUNITY)
Admission: RE | Admit: 2013-03-24 | Discharge: 2013-03-24 | Disposition: A | Discharge status: Home / Self Care | Payer: Medicare Other | Source: Ambulatory Visit | Attending: Family Medicine | Admitting: Family Medicine

## 2013-03-24 DIAGNOSIS — Z124 Encounter for screening for malignant neoplasm of cervix: Secondary | ICD-10-CM | POA: Insufficient documentation

## 2014-01-30 ENCOUNTER — Other Ambulatory Visit (HOSPITAL_COMMUNITY): Payer: Self-pay | Admitting: Family Medicine

## 2014-01-30 DIAGNOSIS — Z1231 Encounter for screening mammogram for malignant neoplasm of breast: Secondary | ICD-10-CM

## 2014-02-08 ENCOUNTER — Ambulatory Visit (HOSPITAL_COMMUNITY)
Admission: RE | Admit: 2014-02-08 | Discharge: 2014-02-08 | Disposition: A | Discharge status: Home / Self Care | Payer: Medicare Other | Source: Ambulatory Visit | Attending: Family Medicine | Admitting: Family Medicine

## 2014-02-08 DIAGNOSIS — Z1231 Encounter for screening mammogram for malignant neoplasm of breast: Secondary | ICD-10-CM

## 2014-02-08 DIAGNOSIS — Z803 Family history of malignant neoplasm of breast: Secondary | ICD-10-CM | POA: Insufficient documentation

## 2014-02-09 ENCOUNTER — Other Ambulatory Visit: Payer: Self-pay | Admitting: Family Medicine

## 2014-02-09 DIAGNOSIS — R928 Other abnormal and inconclusive findings on diagnostic imaging of breast: Secondary | ICD-10-CM

## 2018-05-21 ENCOUNTER — Institutional Professional Consult (permissible substitution): Payer: 59 | Admitting: Pulmonary Disease
